# Patient Record
Sex: Female | Born: 1960 | Race: White | Hispanic: Yes | Marital: Single | Smoking: Former smoker
Health system: Southern US, Community
[De-identification: ages and names within clinical notes are randomized; demographics above are authoritative.]

## PROBLEM LIST (undated history)

## (undated) DIAGNOSIS — J45909 Unspecified asthma, uncomplicated: Secondary | ICD-10-CM

## (undated) DIAGNOSIS — N939 Abnormal uterine and vaginal bleeding, unspecified: Secondary | ICD-10-CM

## (undated) DIAGNOSIS — D709 Neutropenia, unspecified: Secondary | ICD-10-CM

## (undated) DIAGNOSIS — D649 Anemia, unspecified: Secondary | ICD-10-CM

## (undated) DIAGNOSIS — M0520 Rheumatoid vasculitis with rheumatoid arthritis of unspecified site: Secondary | ICD-10-CM

## (undated) HISTORY — DX: Anemia, unspecified: D64.9

## (undated) HISTORY — DX: Neutropenia, unspecified: D70.9

## (undated) HISTORY — DX: Unspecified asthma, uncomplicated: J45.909

## (undated) HISTORY — DX: Rheumatoid vasculitis with rheumatoid arthritis of unspecified site: M05.20

## (undated) HISTORY — DX: Abnormal uterine and vaginal bleeding, unspecified: N93.9

---

## 2007-08-26 ENCOUNTER — Ambulatory Visit: Payer: Self-pay | Admitting: Cardiovascular Disease

## 2007-08-26 ENCOUNTER — Inpatient Hospital Stay (HOSPITAL_COMMUNITY): Admission: EM | Admit: 2007-08-26 | Discharge: 2007-08-31 | Payer: Self-pay | Admitting: Emergency Medicine

## 2007-08-26 ENCOUNTER — Ambulatory Visit: Payer: Self-pay | Admitting: Cardiology

## 2007-08-26 ENCOUNTER — Ambulatory Visit: Payer: Self-pay | Admitting: Emergency Medicine

## 2007-09-20 ENCOUNTER — Encounter: Admission: RE | Admit: 2007-09-20 | Discharge: 2007-09-20 | Payer: Self-pay | Admitting: Family Medicine

## 2007-12-05 DIAGNOSIS — R799 Abnormal finding of blood chemistry, unspecified: Secondary | ICD-10-CM

## 2008-03-15 ENCOUNTER — Other Ambulatory Visit: Admission: RE | Admit: 2008-03-15 | Discharge: 2008-03-15 | Payer: Self-pay | Admitting: Family Medicine

## 2008-03-25 ENCOUNTER — Encounter (INDEPENDENT_AMBULATORY_CARE_PROVIDER_SITE_OTHER): Payer: Self-pay | Admitting: Family Medicine

## 2008-12-30 ENCOUNTER — Ambulatory Visit: Payer: Self-pay | Admitting: Family Medicine

## 2008-12-30 DIAGNOSIS — J45909 Unspecified asthma, uncomplicated: Secondary | ICD-10-CM | POA: Insufficient documentation

## 2008-12-31 ENCOUNTER — Encounter (INDEPENDENT_AMBULATORY_CARE_PROVIDER_SITE_OTHER): Payer: Self-pay | Admitting: Family Medicine

## 2009-01-03 ENCOUNTER — Encounter (INDEPENDENT_AMBULATORY_CARE_PROVIDER_SITE_OTHER): Payer: Self-pay | Admitting: Family Medicine

## 2009-01-12 ENCOUNTER — Encounter (INDEPENDENT_AMBULATORY_CARE_PROVIDER_SITE_OTHER): Payer: Self-pay | Admitting: Family Medicine

## 2009-01-12 LAB — CONVERTED CEMR LAB
ALT: 20 units/L (ref 0–35)
Albumin: 4.1 g/dL (ref 3.5–5.2)
Alkaline Phosphatase: 67 units/L (ref 39–117)
BUN: 7 mg/dL (ref 6–23)
Basophils Absolute: 0 10*3/uL (ref 0.0–0.1)
Cholesterol: 151 mg/dL (ref 0–200)
Creatinine, Ser: 0.63 mg/dL (ref 0.40–1.20)
HDL: 56 mg/dL (ref >39)
LDL Cholesterol: 74 mg/dL (ref 0–99)
Lymphocytes Relative: 25 % (ref 12–46)
Lymphs Abs: 1.4 10*3/uL (ref 0.7–4.0)
MCHC: 30.7 g/dL (ref 30.0–36.0)
MCV: 76.4 fL — ABNORMAL LOW (ref 78.0–100.0)
Monocytes Relative: 13 % — ABNORMAL HIGH (ref 3–12)
Neutro Abs: 3.2 10*3/uL (ref 1.7–7.7)
Neutrophils Relative %: 57 % (ref 43–77)
Platelets: 260 10*3/uL (ref 150–400)
RDW: 15.5 % (ref 11.5–15.5)
Rhuematoid fact SerPl-aCnc: 250 intl units/mL — ABNORMAL HIGH (ref 0–20)
Total Bilirubin: 0.3 mg/dL (ref 0.3–1.2)
Total Protein: 7.5 g/dL (ref 6.0–8.3)
WBC: 5.6 10*3/uL (ref 4.0–10.5)

## 2009-02-12 ENCOUNTER — Ambulatory Visit: Payer: Self-pay | Admitting: Family Medicine

## 2009-02-12 ENCOUNTER — Encounter (INDEPENDENT_AMBULATORY_CARE_PROVIDER_SITE_OTHER): Payer: Self-pay | Admitting: Family Medicine

## 2009-02-12 DIAGNOSIS — H6120 Impacted cerumen, unspecified ear: Secondary | ICD-10-CM | POA: Insufficient documentation

## 2009-02-12 LAB — CONVERTED CEMR LAB
Blood in Urine, dipstick: NEGATIVE
Chlamydia, DNA Probe: NEGATIVE
GC Probe Amp, Genital: NEGATIVE
Nitrite: NEGATIVE
Protein, U semiquant: 30
Urobilinogen, UA: 1
WBC Urine, dipstick: NEGATIVE
pH: 5.5

## 2009-02-13 ENCOUNTER — Ambulatory Visit (HOSPITAL_COMMUNITY): Admission: RE | Admit: 2009-02-13 | Discharge: 2009-02-13 | Payer: Self-pay | Admitting: Family Medicine

## 2009-02-14 ENCOUNTER — Encounter (INDEPENDENT_AMBULATORY_CARE_PROVIDER_SITE_OTHER): Payer: Self-pay | Admitting: Family Medicine

## 2009-03-02 ENCOUNTER — Encounter (INDEPENDENT_AMBULATORY_CARE_PROVIDER_SITE_OTHER): Payer: Self-pay | Admitting: Family Medicine

## 2009-04-07 ENCOUNTER — Encounter (INDEPENDENT_AMBULATORY_CARE_PROVIDER_SITE_OTHER): Payer: Self-pay | Admitting: Family Medicine

## 2009-05-08 ENCOUNTER — Ambulatory Visit: Payer: Self-pay | Admitting: Nurse Practitioner

## 2009-05-09 ENCOUNTER — Encounter (INDEPENDENT_AMBULATORY_CARE_PROVIDER_SITE_OTHER): Payer: Self-pay | Admitting: Nurse Practitioner

## 2010-08-05 ENCOUNTER — Emergency Department (HOSPITAL_BASED_OUTPATIENT_CLINIC_OR_DEPARTMENT_OTHER)
Admission: EM | Admit: 2010-08-05 | Discharge: 2010-08-05 | Payer: Self-pay | Source: Home / Self Care | Admitting: Emergency Medicine

## 2010-08-05 ENCOUNTER — Ambulatory Visit: Payer: Self-pay | Admitting: Diagnostic Radiology

## 2010-08-20 ENCOUNTER — Other Ambulatory Visit: Admission: RE | Admit: 2010-08-20 | Discharge: 2010-08-20 | Payer: Self-pay | Admitting: Family Medicine

## 2010-11-29 ENCOUNTER — Encounter: Payer: Self-pay | Admitting: Family Medicine

## 2011-01-18 IMAGING — CR DG WRIST COMPLETE 3+V*R*
4 series · 4 of 4 positions shown · non-contrast
Comparison: None

CLINICAL DATA: Right arm injury, pain at distal radius status post
fall, history of rheumatoid and osteoarthritis

RIGHT WRIST - COMPLETE 3+ VIEW

[x wrist pa right]
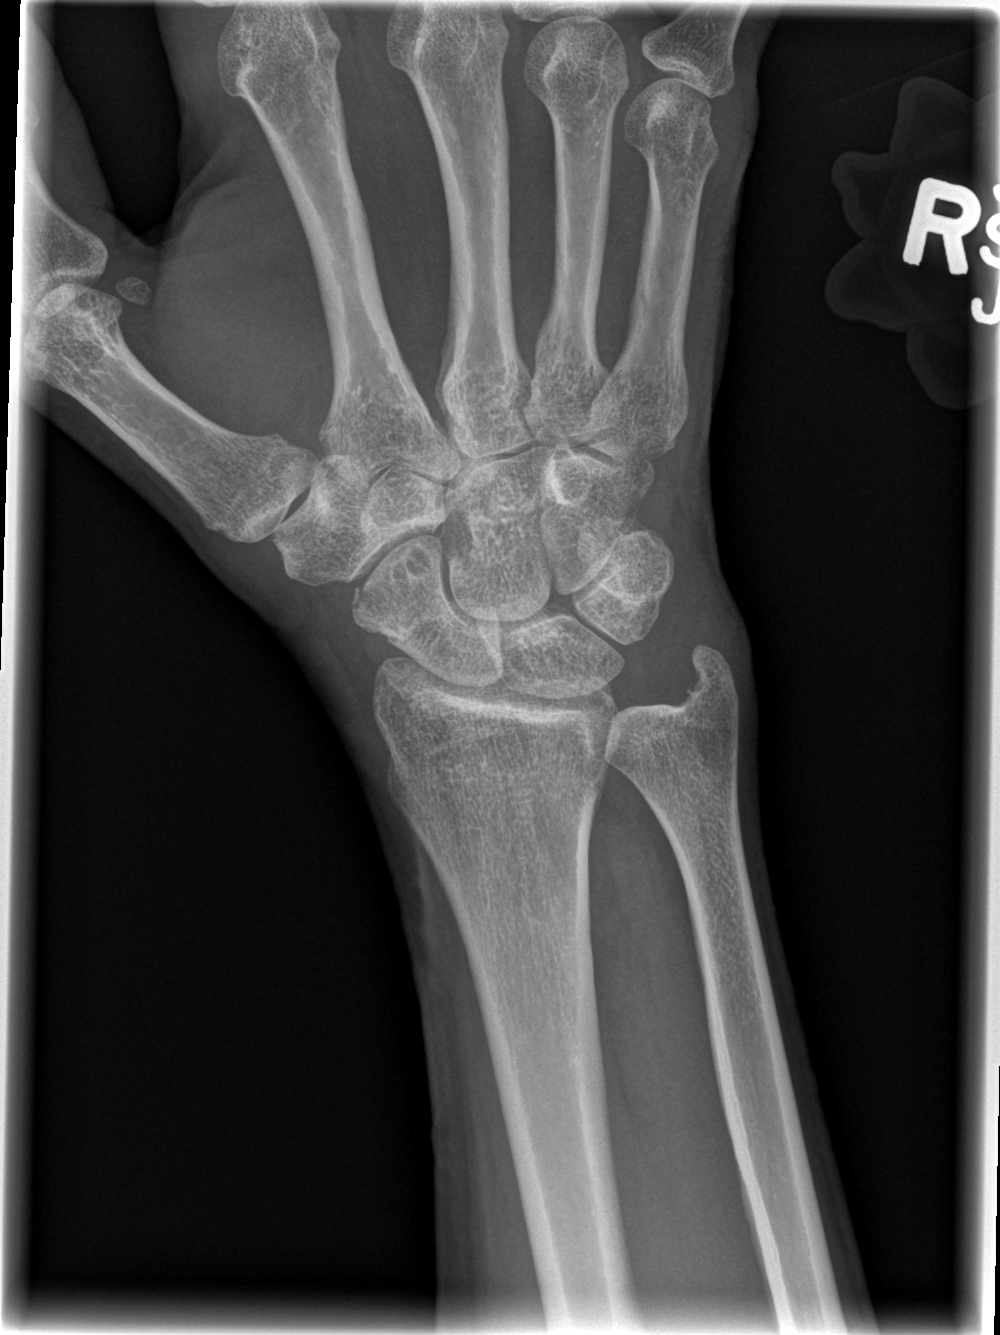

[x wrist obl right]
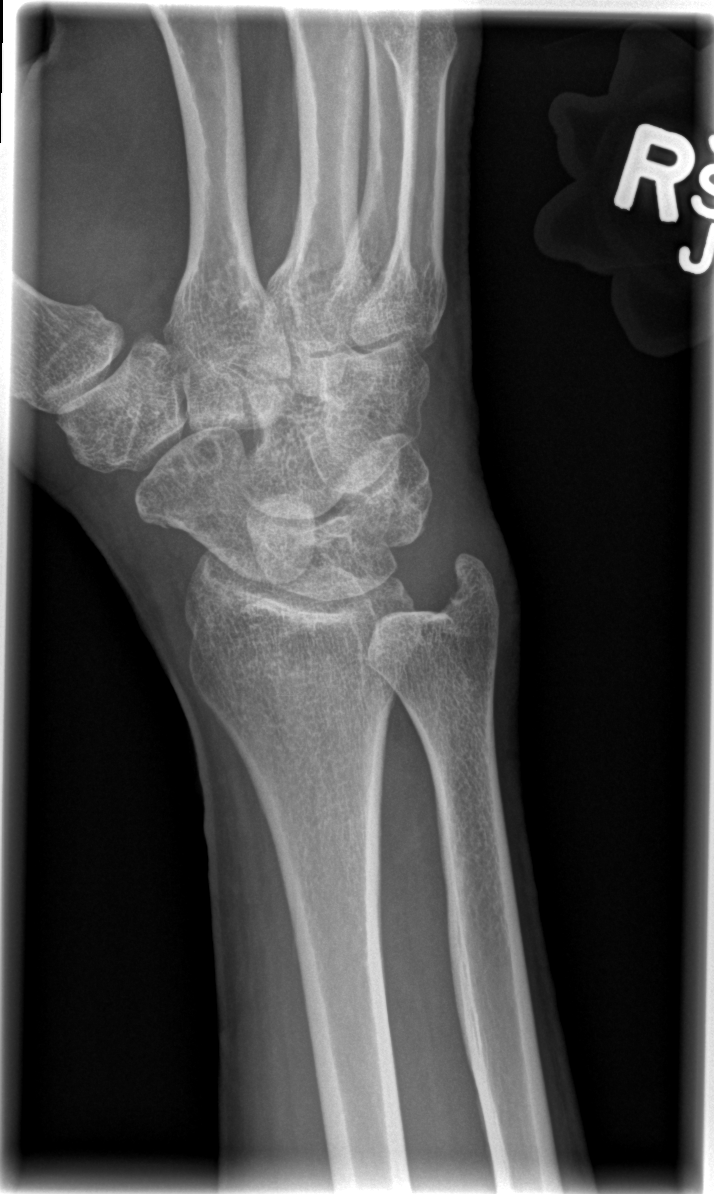

[x wrist lat right]
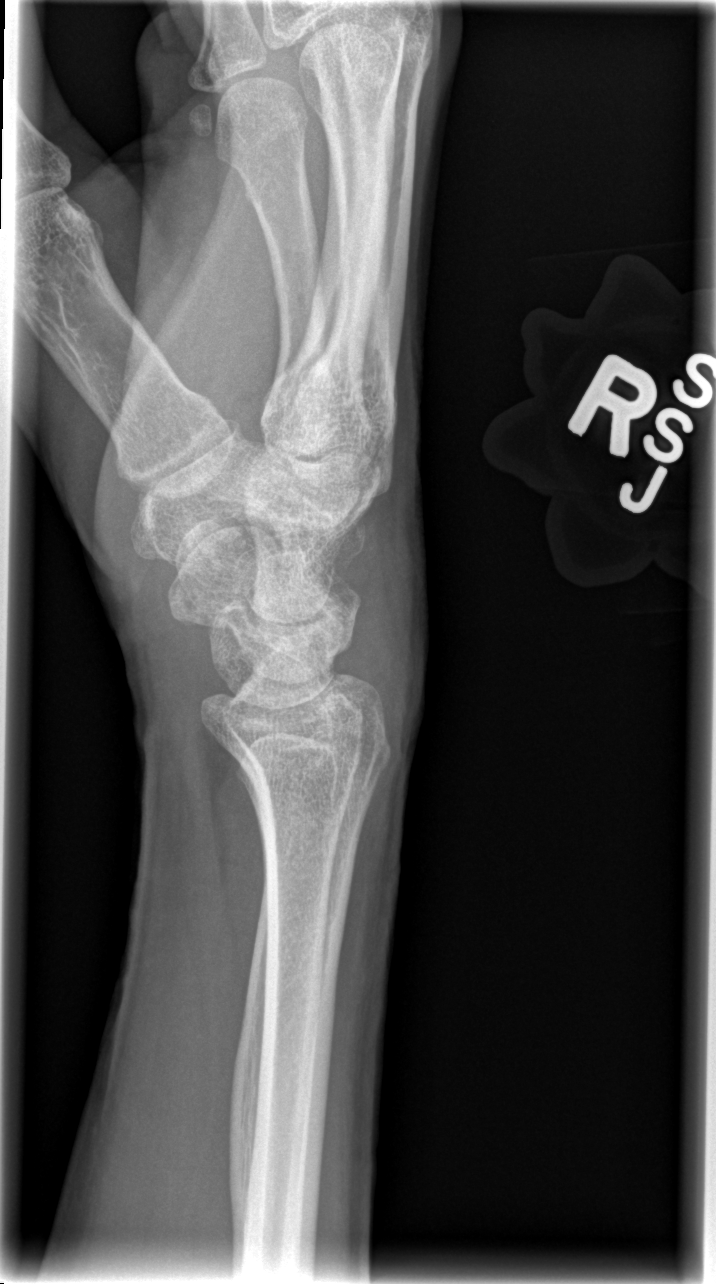

[x navicular]
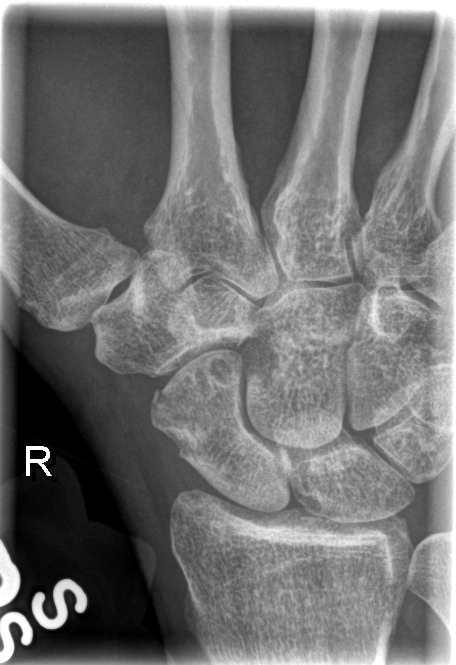

[4 of 4 positions shown; findings below may reference images not displayed]

FINDINGS: Diffuse osseous demineralization.
Tiny chip fracture identified at distal pole scaphoid.
Joint spaces preserved.
Cystic degenerative changes noted at distal pole scaphoid.
Lucency identified at radial border of capitate.
No additional fracture or dislocation seen.
Additional questionable erosion at radial border of lunate.
No definite radial or ulnar fracture identified.
IMPRESSION: Osseous demineralization.
Tiny fracture at distal pole scaphoid.
Questionable erosions at lunate and capitate, raising question of
inflammatory arthropathy such as rheumatoid arthritis or gout.

## 2011-03-23 NOTE — Consult Note (Signed)
NAME:  Misty Turner, Misty Turner.:  [id number]   MEDICAL RECORD NO.:  [id number]          PATIENT TYPE:  INP   LOCATION:  2306                         FACILITY:  [facility]   PHYSICIAN:  Noralyn Pick. Eden Emms, MD, FACCDATE OF BIRTH:  December 12, 1960   DATE OF CONSULTATION:  08/27/2007  DATE OF DISCHARGE:                                 CONSULTATION   REASON FOR CONSULTATION:  Ms. Holloway is an unfortunate 50 year old  patient admitted to the hospital for pneumonia and sepsis.  We were  asked to help evaluate her in regard to a positive troponin to rule out  coronary disease.   History is unavailable from the patient as she is sedated, intubated.  From what I can tell looking through the chart, she has had a 3-4 days  history of productive cough, fever and sputum.  She has children who  would be unattended if she was in the hospital.  She delayed coming for  treatment.  Apparently her parents have come down from [place] to watch  the children.  Her husband is in [place].  In the hospital, her clinical  course has been consistent with pneumonia and sepsis.  She had bilateral  lower lobe infiltrates with a high white and a left shift.   She was complaining of chest pain which was likely pleuritic in nature  and related to her pneumonia, but I cannot elicit this history  currently.  Since she has been in the hospital, her cardiac enzymes  showed troponin of 0.69 and a troponin of 0.72.  CPKs were negative.   She has no history congenital heart disease, rheumatic heart disease or  other.   The patient has no previous documented history of congestive heart  failure or cardiomyopathy.   Unfortunately, there is no EKG in the chart.   REVIEW OF SYSTEMS:  Otherwise, unobtainable.   CURRENT MEDICATIONS:  Tylenol, aspirin.  She has had her dopamine and  Levophed weaned.  She is getting Rocephin, vancomycin, azithromycin.  She has been hydrated.  She has received 2 units of blood.   ALLERGIES:  The chart shows no history allergies.   FAMILY HISTORY:  Unobtainable.   PAST MEDICAL HISTORY:  Would appear to be primarily remarkable for  asthma.   She was not taking any regular medications prior to admission.  History  from the chart indicates she does not drink or smoke.   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  Her husband is  in [place] trying to get a green card.  Parents have come down from [place] to help of with her children.   FAMILY HISTORY:  Noncontributory.   PHYSICAL EXAMINATION:  GENERAL:  Remarkable for and intubated, sedated  white female in no distress.  VITAL SIGNS:  Blood pressure is 100/60.  She is in sinus rhythm at a  rate of 90.  Her sats are currently 97% on the ventilator.  She is  arousable but is getting Versed intermittently for sedation.  Fentanyl  has been stopped.  HEENT:  Unremarkable.  CHEST:  She has a left IJ central line placed.  She has an NG tube and  tracheal airway is in good position.  She has diffuse rhonchi in the  lower lobes.  HEART:  There is an S1, S2 with normal heart sounds.  PMI is not  palpable.  Bowel sounds are positive.  ABDOMEN:  She is not tender in the abdomen.  There is no AAA.  No  hepatosplenomegaly or hepatojugular reflex.  EXTREMITIES:  Femorals are +3 bilaterally.  The PTs are +3.  There is no  lower extremity edema.  NEUROLOGICAL:  Unable to assess neuro except for the fact that she is  arousable and alert with no obvious focal abnormalities.  She is moving  all four extremities.  No obvious muscular weakness.  SKIN:  Warm and dry with no evidence of petechiae.   STUDIES:  Chest x-ray shows bilateral pneumonia.  Her lab work is  remarkable for CPK 156 and 130, troponin 0.69 and 0.72, K 3.3,  creatinine 0.8.  Lactic acid, mildly elevated to 0.3.  Protime elevated  1.9.  White count 27.8, hematocrit was initially 22 before transfusion  and is now up to 30   EKG is not on chart.   Chest x-ray was as indicated, bilateral pneumonia.   IMPRESSION:  1. I do not think the patient has had an acute coronary event.  She      would appear to have limited risk factors with no history of      coronary disease.  She clearly has pneumonia with sepsis.  She will      have a 2-D echocardiogram done in the morning to assess wall      motion.  She does not need systemic heparin.  Would continue      Lovenox for the time-being.  There is no indication for beta      blockers will have to get an EKG on the chart.  2. Sepsis syndrome to be followed by Critical Care, continue triple      antibiotics.  I am not sure of the decision making in regards to      not giving the patient Xigris, although, apparently she had some      blood tinged sputum.  The patient appeared to have low grade DIC      with an elevated protime.  This will also be followed.  3. Significant anemia in the setting of sepsis.  Follow hemoglobin and      hematocrit.  Transfuse as necessary.  4. The patient's overall condition is guarded.  She will need to be on      the ventilator until her chest x-ray clears.  From a cardiac      standpoint, I suspect she will not need further workup outside of      having EKGs on the chart and echocardiogram.  Will have to make      sure that there is no evidence of sepsis induced cardiomyopathy.      Cardiac silhouette on chest x-ray was quite small, and I would be      very surprised outside of the sepsis syndrome if LV function was      poor.      Noralyn Pick. Eden Emms, MD, [facility]  Electronically Signed     PCN/MEDQ  D:  08/27/2007  T:  08/28/2007  Job:  [id number]

## 2011-03-23 NOTE — H&P (Signed)
NAME:  MEKLIT, COTTA.:  [id number]   MEDICAL RECORD NO.:  [id number]          PATIENT TYPE:  INP   LOCATION:  2306                         FACILITY:  [facility]   PHYSICIAN:  Altha Harm, MDDATE OF BIRTH:  [birth year]   DATE OF ADMISSION:  08/26/2007  DATE OF DISCHARGE:                              HISTORY & PHYSICAL   CHIEF COMPLAINT:  Cough and chills.   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  Today  she states that she had fever and chills and dizziness on standing.  The  patient does not have a primary care physician, thus she presented to  the emergency room.  She denies any nausea, vomiting, or diarrhea.  She  states that her oral intake has been normal for the last week.  The  patient does have sick contacts.  She states that several of the  occupants of the daycare center have had a viral pneumonia.   PAST MEDICAL HISTORY:  Significant for a vague history of asthma.   PAST SURGICAL HISTORY:  None.   FAMILY HISTORY:  Noncontributory.   SOCIAL HISTORY:  The patient denies any tobacco, alcohol, or drug use.  She works in a Occupational [employer].  The patient has not received the  influenza vaccine for this year.   CURRENT MEDICATIONS:  None.   ALLERGIES:  NO KNOWN DRUG ALLERGIES.   REVIEW OF SYSTEMS:  Fourteen systems reviewed.  All systems negative  except as noted in the HPI.   STUDIES DONE IN THE EMERGENCY ROOM:  Show the following:  A white blood  cell count of 15.4, hemoglobin of 8.8, hematocrit of 26.9, platelet  count of 216,000.  Sodium of 138, potassium 3.4, chloride 108,  bicarbonate 23, BUN of 9, creatinine 0.75.  A urinalysis shows 3 to 6  WBCs, and 7 to 10 RBCs.  A CT of the chest shows a left lower lobe  pneumonia which was not evident on the chest x-ray.   PHYSICAL EXAMINATION:  VITAL SIGNS:  Rectal temperature of 101.9, blood  pressure  85/46, heart rate 122, O2 sats of 99% on two liters,  respiratory rate 22.  HEENT EXAMINATION:  The patient is normocephalic, atraumatic.  Pupils  equally round and reactive to light and accommodation.  Extraocular  movements are intact.  Fundi are benign.  Tympanic membranes are  translucent bilaterally with good landmarks.  Oropharynx mucosa is  moist.  No exudate, erythema or lesions are noted.  Trachea is midline.  No masses.  No thyromegaly.  No JVD.  No carotid bruits.  RESPIRATORY EXAMINATION:  She has got increased respiratory effort, but  no accessory muscle use.  Equal excursion bilaterally.  The patient has  E to A changes in the left lower lobe, and she has got dullness to  percussion in the left lower lobe.  There is no wheezing or rhonchi  noted.  CARDIOVASCULAR:  She is tachycardic.  No murmurs, rubs, or gallops are  noted.  PMI is nondisplaced.  No heaves or thrills on palpation.  ABDOMINAL EXAMINATION:  Abdomen is soft, nontender, nondistended.  No  masses.  No hepatosplenomegaly.  LYMPH NODE SURVEY:  No cervical, axillary, or inguinal lymphadenopathy  noted.  MUSCULOSKELETAL:  No warmth, swelling, or erythema around the joints.  No spinal tenderness noted.  NEUROLOGICAL:  The patient is alert and oriented x3.  No focal  neurological deficits noted.  Cranial nerves II-XII grossly intact.  DTRs 2+ bilaterally of the upper and lower extremities.  Sensation  intact to light touch and proprioception.  Strength is 5/5 bilaterally  of the upper and lower extremities.  PSYCHIATRIC:  The patient is alert and oriented x3.  She has got good  insight and cognition.  Good recent and remote recall.   ASSESSMENT/PLAN:  1. This is a patient who presents with pneumonia with sepsis.  The      patient will be admitted to the stepdown unit and started on      intravenous ceftriaxone and azithromycin, and given aggressive      fluid support with intravenous fluids.  I also checked a  rapid      influenza test on the patient.  2. In terms of her hypotension, this is likely related to her      pneumonia, and we will continue to support the patient with      intravenous fluids and monitor her blood pressures.  3. In terms of her anemia, there is no evidence of acute blood loss.      This is likely chronic anemia.  Will get an iron panel and a      reticulocyte count on the patient and monitor her hemoglobin.  4. Further treatment based upon results of the initial studies.      Altha Harm, MD  Electronically Signed     MAM/MEDQ  D:  08/26/2007  T:  08/27/2007  Job:  [id number]

## 2011-03-23 NOTE — H&P (Signed)
NAME:  Misty Turner, Misty Turner.:  [id number]   MEDICAL RECORD NO.:  [id number]          PATIENT TYPE:  INP   LOCATION:  5511                         FACILITY:  [facility]   PHYSICIAN:  Lonia Blood, M.D.       DATE OF BIRTH:  04-29-1961   DATE OF ADMISSION:  08/26/2007  DATE OF DISCHARGE:  08/31/2007                              HISTORY & PHYSICAL   PRIMARY CARE PHYSICIAN:  [facility].   DISCHARGE DIAGNOSES:  1. Acute respiratory failure status post intubation and mechanical      ventilation.  2. Bilateral pneumonia of unclear etiology.  3. Bilateral pleural effusions most likely parapneumonic.  4. Iron deficiency anemia.   DISCHARGE MEDICATIONS:  Avelox 400 mg daily fr 5 days   CONDITION ON DISCHARGE:  Misty Turner was discharged in good condition.  At  the time of discharge the patient was afebrile.  Her oxygen saturation  was 94-96% on room air.  The patient was weak, deconditioned, and had  dyspnea on exertion, but, otherwise, she was alert, oriented, and in no  respiratory distress.  The patient was instructed to call [facility] and follow up there as needed.  The patient was  instructed to report back to the emergency room in case she has  recurrent fever, chills, dyspnea or altered mental status.   PROCEDURES DURING THIS ADMISSION:  1. Orotracheal intubation August 26, 2007.  2. Mechanical ventilation October 18 to August 28, 2007.  3. Placement of a left intrajugular central venous catheter August 26, 2007.  4. Placement of a left radial arterial line August 26, 2007.  5. August 27, 2007 transthoracic echocardiogram findings of normal      ejection fraction.   CONSULTATIONS DURING THIS ADMISSION:  1. On August 26, 2007 the patient was seen by Dr. Delton Coombes from critical      care medicine who actually assumed care of the patient from August 26, 2007 until August 29, 2007.  2. Dr. Eden Emms from cardiology.   History and physical,  refer to dictated H&P which was done August 26, 2007 by Dr. Ashley Royalty.   HOSPITAL COURSE:  1. Pneumonia.  Ms. Coke was admitted to the intensive care unit on      August 26, 2007.  The patient was placed on broad-spectrum      antibiotics as well as intravenous fluids.  Her status deteriorated      rapidly with respiratory failure and shock.  The patient had to be      intubated on August 26, 2007, and a central catheter had to be      placed.  The patient went on vasopressors, intravenous fluids, and      ARDS protocol.  The patient had a quick recovery, and by August 29, 2007  she was extubated.  On August 26, 2007 the patient had a      BAL, and the fluid was sent for cultures.  There were no bacterial  isolates in the lavage.  At this point in time we feel that the      patient had suffered bilateral pneumonia most likely viral in      nature.  She is making adequate recovery, and we would expect her      to return completely back to baseline in about a couple of weeks.      Ms. Bahri will complete a course of 10 days of antibiotics by      taking Avelox 400 mg daily for three more days at home.  2. Anemia.  They suspect this is secondary to menorrhagia and iron      deficiency.  The patient was instructed to start iron therapy about      a month after discharge when her status will be back to baseline.  3. Mild elevation in troponins.  The patient was seen by the      cardiology service who felt that the elevation of troponins was      secondary to septic shock.  The patient had a normal      echocardiogram, and cardiology service signed off without further      need for testing.      Lonia Blood, M.D.  Electronically Signed     SL/MEDQ  D:  08/31/2007  T:  09/01/2007  Job:  [id number]

## 2011-08-18 LAB — CARDIAC PANEL(CRET KIN+CKTOT+MB+TROPI)
CK, MB: 1.6
CK, MB: 2.4
CK, MB: 3.5
CK, MB: 4.7 — ABNORMAL HIGH
CK, MB: 6.4 — ABNORMAL HIGH
Relative Index: 1.8
Relative Index: 2.2
Relative Index: 3 — ABNORMAL HIGH
Relative Index: 4.9 — ABNORMAL HIGH
Total CK: 130
Total CK: 132
Total CK: 66
Troponin I: 0.12 — ABNORMAL HIGH
Troponin I: 0.27 — ABNORMAL HIGH
Troponin I: 0.72

## 2011-08-18 LAB — HEPATIC FUNCTION PANEL
AST: 31
Albumin: 1.8 — ABNORMAL LOW
Bilirubin, Direct: 0.2
Total Protein: 4.5 — ABNORMAL LOW

## 2011-08-18 LAB — POCT I-STAT 7, (LYTES, BLD GAS, ICA,H+H)
Acid-base deficit: 9 — ABNORMAL HIGH
Calcium, Ion: 1 — ABNORMAL LOW
Calcium, Ion: 1.05 — ABNORMAL LOW
HCT: 31 — ABNORMAL LOW
O2 Saturation: 98
O2 Saturation: 99
Operator id: 199901
Operator id: 199901
Patient temperature: 100.1
Potassium: 2.8 — ABNORMAL LOW
Potassium: 2.9 — ABNORMAL LOW
pCO2 arterial: 24.6 — ABNORMAL LOW
pCO2 arterial: 29.6 — ABNORMAL LOW
pH, Arterial: 7.41 — ABNORMAL HIGH
pO2, Arterial: 126 — ABNORMAL HIGH

## 2011-08-18 LAB — CROSSMATCH

## 2011-08-18 LAB — DIFFERENTIAL
Basophils Absolute: 0
Lymphocytes Relative: 2 — ABNORMAL LOW
Monocytes Absolute: 1 — ABNORMAL HIGH

## 2011-08-18 LAB — POCT I-STAT 3, ART BLOOD GAS (G3+)
Acid-base deficit: 10 — ABNORMAL HIGH
Acid-base deficit: 14 — ABNORMAL HIGH
Bicarbonate: 15.2 — ABNORMAL LOW
Bicarbonate: 15.2 — ABNORMAL LOW
Bicarbonate: 16.2 — ABNORMAL LOW
O2 Saturation: 97
O2 Saturation: 98
O2 Saturation: 99
Operator id: 199901
Operator id: 222511
Operator id: 279121
Operator id: 283371
Patient temperature: 99.2
TCO2: 16
TCO2: 17
TCO2: 17
pCO2 arterial: 29.8 — ABNORMAL LOW
pCO2 arterial: 31.5 — ABNORMAL LOW
pCO2 arterial: 33.2 — ABNORMAL LOW
pCO2 arterial: 37.9
pH, Arterial: 7.261 — ABNORMAL LOW
pH, Arterial: 7.294 — ABNORMAL LOW
pH, Arterial: 7.297 — ABNORMAL LOW
pH, Arterial: 7.305 — ABNORMAL LOW
pH, Arterial: 7.343 — ABNORMAL LOW
pH, Arterial: 7.36
pO2, Arterial: 111 — ABNORMAL HIGH
pO2, Arterial: 124 — ABNORMAL HIGH
pO2, Arterial: 73 — ABNORMAL LOW

## 2011-08-18 LAB — CBC
HCT: 22.6 — ABNORMAL LOW
HCT: 26.6 — ABNORMAL LOW
HCT: 26.9 — ABNORMAL LOW
HCT: 27.1 — ABNORMAL LOW
HCT: 30.1 — ABNORMAL LOW
HCT: 32.2 — ABNORMAL LOW
Hemoglobin: 10.6 — ABNORMAL LOW
Hemoglobin: 8.8 — ABNORMAL LOW
Hemoglobin: 8.8 — ABNORMAL LOW
Hemoglobin: 9 — ABNORMAL LOW
Hemoglobin: 9.9 — ABNORMAL LOW
MCHC: 32.3
MCHC: 32.8
MCHC: 32.9
MCHC: 33.1
MCHC: 33.2
MCV: 71.5 — ABNORMAL LOW
MCV: 74.2 — ABNORMAL LOW
MCV: 74.6 — ABNORMAL LOW
MCV: 75.1 — ABNORMAL LOW
MCV: 75.3 — ABNORMAL LOW
Platelets: 199
Platelets: 200
Platelets: 207
Platelets: 218
Platelets: 220
Platelets: 276
RBC: 2.79 — ABNORMAL LOW
RBC: 3.57 — ABNORMAL LOW
RBC: 3.65 — ABNORMAL LOW
RBC: 4.01
RBC: 4.28
RDW: 17 — ABNORMAL HIGH
RDW: 17.4 — ABNORMAL HIGH
RDW: 17.4 — ABNORMAL HIGH
RDW: 19 — ABNORMAL HIGH
RDW: 19.9 — ABNORMAL HIGH
RDW: 20 — ABNORMAL HIGH
RDW: 20.2 — ABNORMAL HIGH
RDW: 20.3 — ABNORMAL HIGH
WBC: 15.4 — ABNORMAL HIGH
WBC: 15.8 — ABNORMAL HIGH
WBC: 20.9 — ABNORMAL HIGH
WBC: 27.8 — ABNORMAL HIGH
WBC: 41.6 — ABNORMAL HIGH

## 2011-08-18 LAB — LACTIC ACID, PLASMA
Lactic Acid, Venous: 1.2
Lactic Acid, Venous: 2.3 — ABNORMAL HIGH
Lactic Acid, Venous: 3.2 — ABNORMAL HIGH

## 2011-08-18 LAB — BASIC METABOLIC PANEL
BUN: 10
BUN: 11
BUN: 3 — ABNORMAL LOW
BUN: 8
CO2: 18 — ABNORMAL LOW
CO2: 19
CO2: 23
Calcium: 5.9 — CL
Calcium: 6.2 — CL
Calcium: 6.2 — CL
Calcium: 6.4 — CL
Calcium: 8.1 — ABNORMAL LOW
Calcium: 8.1 — ABNORMAL LOW
Chloride: 113 — ABNORMAL HIGH
Chloride: 114 — ABNORMAL HIGH
Creatinine, Ser: 0.72
Creatinine, Ser: 0.72
Creatinine, Ser: 0.76
Creatinine, Ser: 0.88
GFR calc Af Amer: >60
GFR calc Af Amer: >60
GFR calc Af Amer: >60
GFR calc Af Amer: >60
GFR calc non Af Amer: >60
GFR calc non Af Amer: >60
GFR calc non Af Amer: >60
GFR calc non Af Amer: >60
GFR calc non Af Amer: >60
GFR calc non Af Amer: >60
Glucose, Bld: 114 — ABNORMAL HIGH
Glucose, Bld: 63 — ABNORMAL LOW
Glucose, Bld: 70
Glucose, Bld: 72
Glucose, Bld: 80
Potassium: 3.3 — ABNORMAL LOW
Potassium: 3.3 — ABNORMAL LOW
Sodium: 137
Sodium: 138
Sodium: 138
Sodium: 138
Sodium: 140

## 2011-08-18 LAB — CULTURE, BLOOD (ROUTINE X 2)

## 2011-08-18 LAB — DIC (DISSEMINATED INTRAVASCULAR COAGULATION)PANEL: Smear Review: NONE SEEN

## 2011-08-18 LAB — ABO/RH: ABO/RH(D): B NEG

## 2011-08-18 LAB — IRON: Iron: <10 — ABNORMAL LOW

## 2011-08-18 LAB — URINALYSIS, ROUTINE W REFLEX MICROSCOPIC
Ketones, ur: 15 — AB
Nitrite: NEGATIVE
Protein, ur: NEGATIVE
Urobilinogen, UA: 1
pH: 6

## 2011-08-18 LAB — COMPREHENSIVE METABOLIC PANEL
Alkaline Phosphatase: 57
BUN: 4 — ABNORMAL LOW
Chloride: 110
GFR calc non Af Amer: >60
Glucose, Bld: 71
Potassium: 3.1 — ABNORMAL LOW
Total Bilirubin: 0.9

## 2011-08-18 LAB — RETICULOCYTES
RBC.: 2.92 — ABNORMAL LOW
Retic Count, Absolute: 29.2
Retic Ct Pct: 1

## 2011-08-18 LAB — CULTURE, BAL-QUANTITATIVE W GRAM STAIN

## 2011-08-18 LAB — INFLUENZA A+B VIRUS AG-DIRECT(RAPID): Influenza B Ag: NEGATIVE

## 2011-08-18 LAB — PROTIME-INR
INR: 1.4
INR: 2 — ABNORMAL HIGH
Prothrombin Time: 17 — ABNORMAL HIGH
Prothrombin Time: 22.2 — ABNORMAL HIGH
Prothrombin Time: 23.6 — ABNORMAL HIGH

## 2011-08-18 LAB — CALCIUM, IONIZED: Calcium, Ion: 1 — ABNORMAL LOW

## 2011-08-18 LAB — FIBRINOGEN: Fibrinogen: 534 — ABNORMAL HIGH

## 2011-08-18 LAB — PHOSPHORUS: Phosphorus: 2 — ABNORMAL LOW

## 2011-08-18 LAB — MAGNESIUM: Magnesium: 1.6

## 2011-08-18 LAB — CARBOXYHEMOGLOBIN: O2 Saturation: 77.5

## 2011-08-18 LAB — D-DIMER, QUANTITATIVE: D-Dimer, Quant: 0.53 — ABNORMAL HIGH

## 2011-08-18 LAB — URINE MICROSCOPIC-ADD ON

## 2016-12-07 ENCOUNTER — Encounter: Payer: Self-pay | Admitting: Obstetrics and Gynecology

## 2016-12-07 ENCOUNTER — Ambulatory Visit (INDEPENDENT_AMBULATORY_CARE_PROVIDER_SITE_OTHER): Payer: Managed Care, Other (non HMO) | Admitting: Obstetrics and Gynecology

## 2016-12-07 VITALS — BP 124/80 | HR 92 | Resp 15 | Ht 68.5 in | Wt 138.0 lb

## 2016-12-07 DIAGNOSIS — N898 Other specified noninflammatory disorders of vagina: Secondary | ICD-10-CM | POA: Diagnosis not present

## 2016-12-07 DIAGNOSIS — N939 Abnormal uterine and vaginal bleeding, unspecified: Secondary | ICD-10-CM

## 2016-12-07 LAB — CBC
HCT: 32 % — ABNORMAL LOW (ref 35.0–45.0)
Hemoglobin: 10.6 g/dL — ABNORMAL LOW (ref 11.7–15.5)
MCH: 28.4 pg (ref 27.0–33.0)
MCHC: 33.1 g/dL (ref 32.0–36.0)
MCV: 85.8 fL (ref 80.0–100.0)
MPV: 9.5 fL (ref 7.5–12.5)
Platelets: 221 10*3/uL (ref 140–400)
RBC: 3.73 MIL/uL — AB (ref 3.80–5.10)
RDW: 14 % (ref 11.0–15.0)
WBC: 2.7 10*3/uL — ABNORMAL LOW (ref 3.8–10.8)

## 2016-12-07 LAB — FERRITIN: Ferritin: 18 ng/mL (ref 10–232)

## 2016-12-07 LAB — TSH: TSH: 1.94 mIU/L

## 2016-12-07 NOTE — Progress Notes (Addendum)
56 y.o. [id number] SingleCaucasianF here c/o irregular menstrual bleeding.  Spotted daily from July to December, 2017. Stopped bleeding in late December. She does notice some vaginal odor. Last menstrual cycle (11/23/16)  was very heavy and lasted much longer than normal. She is still spotting.  She bleed very heavily for 2 days, went through 28 pads, then it slowed down a little. Now just spotting. No pain, no weakness, otherwise feels okay.  She was seen in the ER last week and had a hgb 10.7 gm/dl. Not currently sexually active.  Period Duration (Days): usually 6 days  Period Pattern: (!) Irregular Menstrual Flow: Heavy, Light Menstrual Control: Maxi pad Menstrual Control Change Freq (Hours): changes pad every hour  Dysmenorrhea: (!) Mild Dysmenorrhea Symptoms: Cramping  Patient's last menstrual period was 11/23/2016.          Sexually active: No.  The current method of family planning is none.    Exercising: No.  The patient does not participate in regular exercise at present. Smoker:  Former Smoker  Health Maintenance: Pap: 05/2016 WNL per patient  History of abnormal Pap:  Yes about 20 years ago- had a procedure but unsure of what it was called MMG:  Never Colonoscopy:  Never BMD:  Unsure   TDaP:  04-07-09  Gardasil: N/A   reports that she has quit smoking. Her smoking use included Cigarettes. She has a 2.50 pack-year smoking history. She has never used smokeless tobacco. She reports that she drinks about 0.6 oz of alcohol per week . 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 2 daughters 3314 and almost [id number].  Past Medical History:  Diagnosis Date  . Abnormal uterine bleeding   . Anemia   . Asthma   . Neutropenia (HCC)   . Rheumatoid arteritis     No past surgical history on file.  Current Outpatient Prescriptions  Medication Sig Dispense Refill  . acyclovir ointment (ZOVIRAX) 5 % Apply topically.    .  Adalimumab (HUMIRA PEN) 40 MG/0.8ML PNKT Inject into the skin.    Marland Kitchen. albuterol (PROVENTIL HFA;VENTOLIN HFA) 108 (90 Base) MCG/ACT inhaler Inhale into the lungs.    . Cyanocobalamin (B-12) 1000 MCG CAPS      No current facility-administered medications for this visit.     Family History  Problem Relation Age of Onset  . Heart disease Mother   . Hypertension Mother   . Rheum arthritis Mother   . Heart attack Father   . Rheum arthritis Maternal Grandmother     Review of Systems  Constitutional: Negative.   HENT: Negative.   Eyes: Negative.   Respiratory: Negative.   Cardiovascular: Negative.   Gastrointestinal: Negative.   Endocrine: Negative.   Genitourinary: Positive for menstrual problem.       Heavy menstrual bleeding Cycling lasting longer   Musculoskeletal: Negative.   Skin: Negative.   Allergic/Immunologic: Negative.   Neurological: Negative.   Psychiatric/Behavioral: Negative.     Exam:   BP 124/80 (BP Location: Right Arm, Patient Position: Sitting, Cuff Size: Normal)   Pulse 92   Resp 15   Ht 5' 8.5" (1.74 m)   Wt 138 lb (62.6 kg)   LMP 11/23/2016   BMI 20.68 kg/m   Weight change: @WEIGHTCHANGE @ Height:   Height: 5' 8.5" (174 cm)  Ht Readings from Last 3 Encounters:  12/07/16 5' 8.5" (1.74 m)  02/12/09 5\' 8"  (1.727 m)  12/30/08 5\' 8"  (1.727  m)    General appearance: alert, cooperative and appears stated age Head: Normocephalic, without obvious abnormality, atraumatic Neck: no adenopathy, supple, symmetrical, trachea midline and thyroid normal to inspection and palpation Lungs: clear to auscultation bilaterally Cardiovascular: regular rate and rhythm Heart: regular rate and rhythm Abdomen: soft, non-tender; bowel sounds normal; no masses,  no organomegaly Extremities: extremities normal, atraumatic, no cyanosis or edema Skin: Skin color, texture, turgor normal. No rashes or lesions Lymph nodes: Cervical, supraclavicular, and axillary nodes normal. No  abnormal inguinal nodes palpated Neurologic: Grossly normal   Pelvic: External genitalia:  no lesions              Urethra:  normal appearing urethra with no masses, tenderness or lesions              Bartholins and Skenes: normal                 Vagina: normal appearing vagina with normal color and discharge, no lesions              Cervix: no lesions               Bimanual Exam:  Uterus:  normal size, contour, position, consistency, mobility, non-tender and anteverted              Adnexa: no mass, fullness, tenderness               Rectovaginal: Confirms               Anus:  normal sphincter tone, no lesions  The risks of endometrial biopsy were reviewed and a consent was obtained.  A speculum was placed in the vagina and the cervix was cleansed with betadine. A mini-pipelle was placed into the endometrial cavity. The uterus sounded to 7 cm. The endometrial biopsy was performed, moderate tissue was obtained. The tenaculum and speculum were removed. There were no complications.    Chaperone was present for exam.  A:  Abnormal uterine bleeding  Anemia  H/O rheumatoid arthritis, on Humara  Vaginal odor  P:   Endometrial biopsy  CBC, Ferritin, TSH  Return for ultrasound and possible sonohysterogram  Recommended yearly paps since she is on a immunosuppressant   Wet prep probe

## 2016-12-07 NOTE — Addendum Note (Signed)
Addended by: Tobi BastosJERTSON, JILL E on: 12/07/2016 05:03 PM   Modules accepted: Orders

## 2016-12-07 NOTE — Patient Instructions (Signed)

## 2016-12-08 ENCOUNTER — Telehealth: Payer: Self-pay | Admitting: *Deleted

## 2016-12-08 LAB — WET PREP BY MOLECULAR PROBE
Candida species: NEGATIVE
Gardnerella vaginalis: POSITIVE — AB
Trichomonas vaginosis: NEGATIVE

## 2016-12-08 NOTE — Telephone Encounter (Signed)
Left message to call regarding lab results -eh 

## 2016-12-08 NOTE — Telephone Encounter (Signed)
-----   Message from Romualdo BolkJill Evelyn Jertson, MD sent at 12/08/2016  9:35 AM EST ----- Please inform the patient that her vaginitis probe was + for BV and treat with flagyl (either oral or vaginal, her choice), no ETOH while on Flagyl.  Oral: Flagyl 500 mg BID x 7 days, or Vaginal: Metrogel, 1 applicator per vagina q day x 5 days. She is mildly anemic with low normal iron stores. TSH is normal Have her start taking oral iron, 1 tab po q day (slow fe or ferrous gluconate are good options).  Pathology report is pending

## 2016-12-09 ENCOUNTER — Telehealth: Payer: Self-pay | Admitting: Obstetrics and Gynecology

## 2016-12-09 NOTE — Telephone Encounter (Signed)
Called patient to review benefits for a recommended sonohysterogram. Left Voicemail requesting a call back. °

## 2016-12-10 NOTE — Telephone Encounter (Signed)
Called patient to review benefits for a recommended sonohysterogram. Left Voicemail requesting a call back. °

## 2016-12-13 NOTE — Telephone Encounter (Signed)
Called patient to review benefits for a recommended sonohysterogram. Left Voicemail requesting a call back. °

## 2016-12-15 ENCOUNTER — Encounter: Payer: Self-pay | Admitting: Obstetrics and Gynecology

## 2016-12-16 ENCOUNTER — Telehealth: Payer: Self-pay | Admitting: *Deleted

## 2016-12-16 NOTE — Telephone Encounter (Signed)
Dr. Oscar LaJertson, [facility], see patient message below and advise?       It is difficult to do that but I will try. I have another question.     I just got a message regarding my upcoming appointment. It says I should be on day 7-10 of my cycle. I am perimenopausal and no longer have a predictable cycle. Three weeks ago I bled for 6 straight days heavily and I had spotting and light bleeding for the entire 3 months prior to that. Before July, I was skipping one or two months in between periods. Is that going to be a problem for this test?    Thank you!  ----- Message -----  From: Nurse Sherilyn CooterJill N H  Sent: 12/15/2016 10:45 AM EST  To: Lenise ArenaKim Strothman  Subject: RE: Non-Urgent Medical Question  Ms. Tippen,     Good morning. Please call the office at [phone], so that we may review your recent lab results. Have a great day!    Sincerely,  Carmelina DaneJill Chyrl Elwell, RN    ----- Message -----   From: Lenise ArenaKim Schaben   Sent: 12/15/2016 8:42 AM EST    To: Romualdo BolkJill Evelyn Jertson, MD  Subject: Non-Urgent Medical Question    Salutations,    So I see from the results that the odor was most like from the imbalance of organisms. Since I am asymptomatic beside the odor (which has mostly dissipated since I had all that bleeding), do I still need to do anything? Will the environment return to stasis on its own?

## 2016-12-16 NOTE — Telephone Encounter (Signed)
I would still recommend treatment if she is having any symptoms of BV.  The sonohysterogram can be done any time for her. The cycle recommendations are for women who are still having regular cycles.

## 2016-12-16 NOTE — Telephone Encounter (Signed)
See telephone encounter dated 12/16/16. 

## 2016-12-17 NOTE — Telephone Encounter (Signed)
Call to patient with response from Dr Oscar LaJertson. Left message to call back for triage nurse.

## 2016-12-21 ENCOUNTER — Other Ambulatory Visit: Payer: Self-pay | Admitting: Obstetrics and Gynecology

## 2016-12-21 ENCOUNTER — Ambulatory Visit (INDEPENDENT_AMBULATORY_CARE_PROVIDER_SITE_OTHER): Payer: Managed Care, Other (non HMO)

## 2016-12-21 ENCOUNTER — Encounter: Payer: Self-pay | Admitting: Obstetrics and Gynecology

## 2016-12-21 ENCOUNTER — Ambulatory Visit (INDEPENDENT_AMBULATORY_CARE_PROVIDER_SITE_OTHER): Payer: Managed Care, Other (non HMO) | Admitting: Obstetrics and Gynecology

## 2016-12-21 VITALS — BP 128/84 | HR 64 | Resp 16 | Wt 141.0 lb

## 2016-12-21 DIAGNOSIS — N939 Abnormal uterine and vaginal bleeding, unspecified: Secondary | ICD-10-CM

## 2016-12-21 MED ORDER — MEDROXYPROGESTERONE ACETATE 5 MG PO TABS: ORAL_TABLET | ORAL | 1 refills | Status: AC

## 2016-12-21 NOTE — Progress Notes (Signed)
GYNECOLOGY  VISIT   HPI: 56 y.o.   Single  Caucasian  female   [phone] with Patient's last menstrual period was 11/23/2016.   here for follow up abnormal uterine bleeding. She bleed from July to December then had a heavy prolonged bleed in January. Prior to July she was having a cycle about every 3 months. She was seen 2 weeks ago, endometrial biopsy showed inactive endometrium. She c/o night sweats one time a night, can go back to sleep. No hot flashes. No vaginal dryness,   GYNECOLOGIC HISTORY: Patient's last menstrual period was 11/23/2016. Contraception:none  Menopausal hormone therapy: none        OB History    Gravida Para Term Preterm AB Living   6 2 2   4 2    SAB TAB Ectopic Multiple Live Births   2       2         Patient Active Problem List   Diagnosis Date Noted  . CERUMEN IMPACTION, BILATERAL 02/12/2009  . ASTHMA 12/30/2008  . RHEUMATOID FACTOR, POSITIVE 12/05/2007    Past Medical History:  Diagnosis Date  . Abnormal uterine bleeding   . Anemia   . Asthma   . Neutropenia (HCC)   . Rheumatoid arteritis     No past surgical history on file.  Current Outpatient Prescriptions  Medication Sig Dispense Refill  . acyclovir ointment (ZOVIRAX) 5 % Apply topically.    . Adalimumab (HUMIRA PEN) 40 MG/0.8ML PNKT Inject into the skin.    Marland Kitchen. albuterol (PROVENTIL HFA;VENTOLIN HFA) 108 (90 Base) MCG/ACT inhaler Inhale into the lungs.    . Cyanocobalamin (B-12) 1000 MCG CAPS      No current facility-administered medications for this visit.      ALLERGIES: Patient has no known allergies.  Family History  Problem Relation Age of Onset  . Heart disease Mother   . Hypertension Mother   . Rheum arthritis Mother   . Heart attack Father   . Rheum arthritis Maternal Grandmother     Social History   Social History  . Marital status: Single    Spouse name: N/A  . Number of children: N/A  . Years of education: N/A   Occupational History  . Not on file.   Social  History Main Topics  . Smoking status: Former Smoker    Packs/day: 0.50    Years: 5.00    Types: Cigarettes  . Smokeless tobacco: Never Used  . Alcohol use 0.6 oz/week    1 Glasses of wine per week  . Drug use: No  . Sexual activity: No   Other Topics Concern  . Not on file   Social History Narrative  . No narrative on file    Review of Systems  Constitutional: Negative.   HENT: Negative.   Eyes: Negative.   Respiratory: Negative.   Cardiovascular: Negative.   Gastrointestinal: Negative.   Genitourinary: Negative.   Musculoskeletal: Negative.   Skin: Negative.   Neurological: Negative.   Endo/Heme/Allergies: Negative.   Psychiatric/Behavioral: Negative.     PHYSICAL EXAMINATION:    BP 128/84 (BP Location: Right Arm, Patient Position: Sitting, Cuff Size: Normal)   Pulse 64   Resp 16   Wt 141 lb (64 kg)   LMP 11/23/2016   BMI 21.13 kg/m     General appearance: alert, cooperative and appears stated age  Pelvic: External genitalia:  no lesions              Urethra:  normal  appearing urethra with no masses, tenderness or lesions              Bartholins and Skenes: normal                 Vagina: normal appearing vagina with normal color and discharge, no lesions              Cervix: no lesions                 Sonohysterogram The procedure and risks of the procedure were reviewed with the patient, consent form was signed. A speculum was placed in the vagina and the cervix was cleansed with betadine. The sonohysterogram catheter was inserted into the uterine cavity without difficulty. Saline was infused under direct observation with the ultrasound. No intracavitary defects were noted.The catheter was removed.    Chaperone was present for exam.  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 Negative w/u. Went off of her humara for 6 months, didn't help. She has been fine, no  increase in infections.  She is aware of her anemia, doesn't tolerate oral iron    PLAN Discussed the options of just watching her cycles, using cyclic provera and a mirena IUD Will give her a script for provera She has increased the iron in her diet, she will f/u with her Rheumatologist (will have a CBC there in 1 month) Call with abnormal bleeding or any other concerns   An After Visit Summary was printed and given to the patient.  15 minutes face to face time of which over 50% was spent in counseling.   CC: Dr Mariah Milling

## 2016-12-21 NOTE — Telephone Encounter (Signed)
Patient seen in office 12/21/16, routing to provider for final review,will close encounter.

## 2016-12-21 NOTE — Telephone Encounter (Signed)
Patient seen in office 12/21/16, will close encounter.

## 2016-12-21 NOTE — Telephone Encounter (Signed)
Spoke with patient -see result note -eh
